# Patient Record
Sex: Male | Born: 1985 | Race: White | Hispanic: No | Marital: Single | Smoking: Never smoker
Health system: Southern US, Community
[De-identification: ages and names within clinical notes are randomized; demographics above are authoritative.]

## PROBLEM LIST (undated history)

## (undated) DIAGNOSIS — F32A Depression, unspecified: Secondary | ICD-10-CM

## (undated) DIAGNOSIS — F419 Anxiety disorder, unspecified: Secondary | ICD-10-CM

## (undated) DIAGNOSIS — F329 Major depressive disorder, single episode, unspecified: Secondary | ICD-10-CM

## (undated) DIAGNOSIS — R319 Hematuria, unspecified: Secondary | ICD-10-CM

## (undated) HISTORY — DX: Depression, unspecified: F32.A

## (undated) HISTORY — DX: Major depressive disorder, single episode, unspecified: F32.9

## (undated) HISTORY — DX: Hematuria, unspecified: R31.9

## (undated) HISTORY — PX: TYMPANOSTOMY TUBE PLACEMENT: SHX32

## (undated) HISTORY — DX: Anxiety disorder, unspecified: F41.9

---
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
## 2006-11-30 IMAGING — CR DG HAND COMPLETE 3+V*L*
1 series · 3 of 3 positions shown · non-contrast
Comparison: none

REASON FOR EXAM: Injury
COMMENTS:

PROCEDURE:     DXR - DXR HAND LT COMPLETE  W/OBLIQUES  - January 22, 2005 [DATE]
RESULT:     A transverse fracture is demonstrated along the midshaft of the
fourth metacarpal.  The distal fracture fragment demonstrates volar
angulation.  No further fractures or dislocations are appreciated.

[Series 1: view not recorded · 0.17mm/px · 3 of 3 slices shown]
[im 1/3]
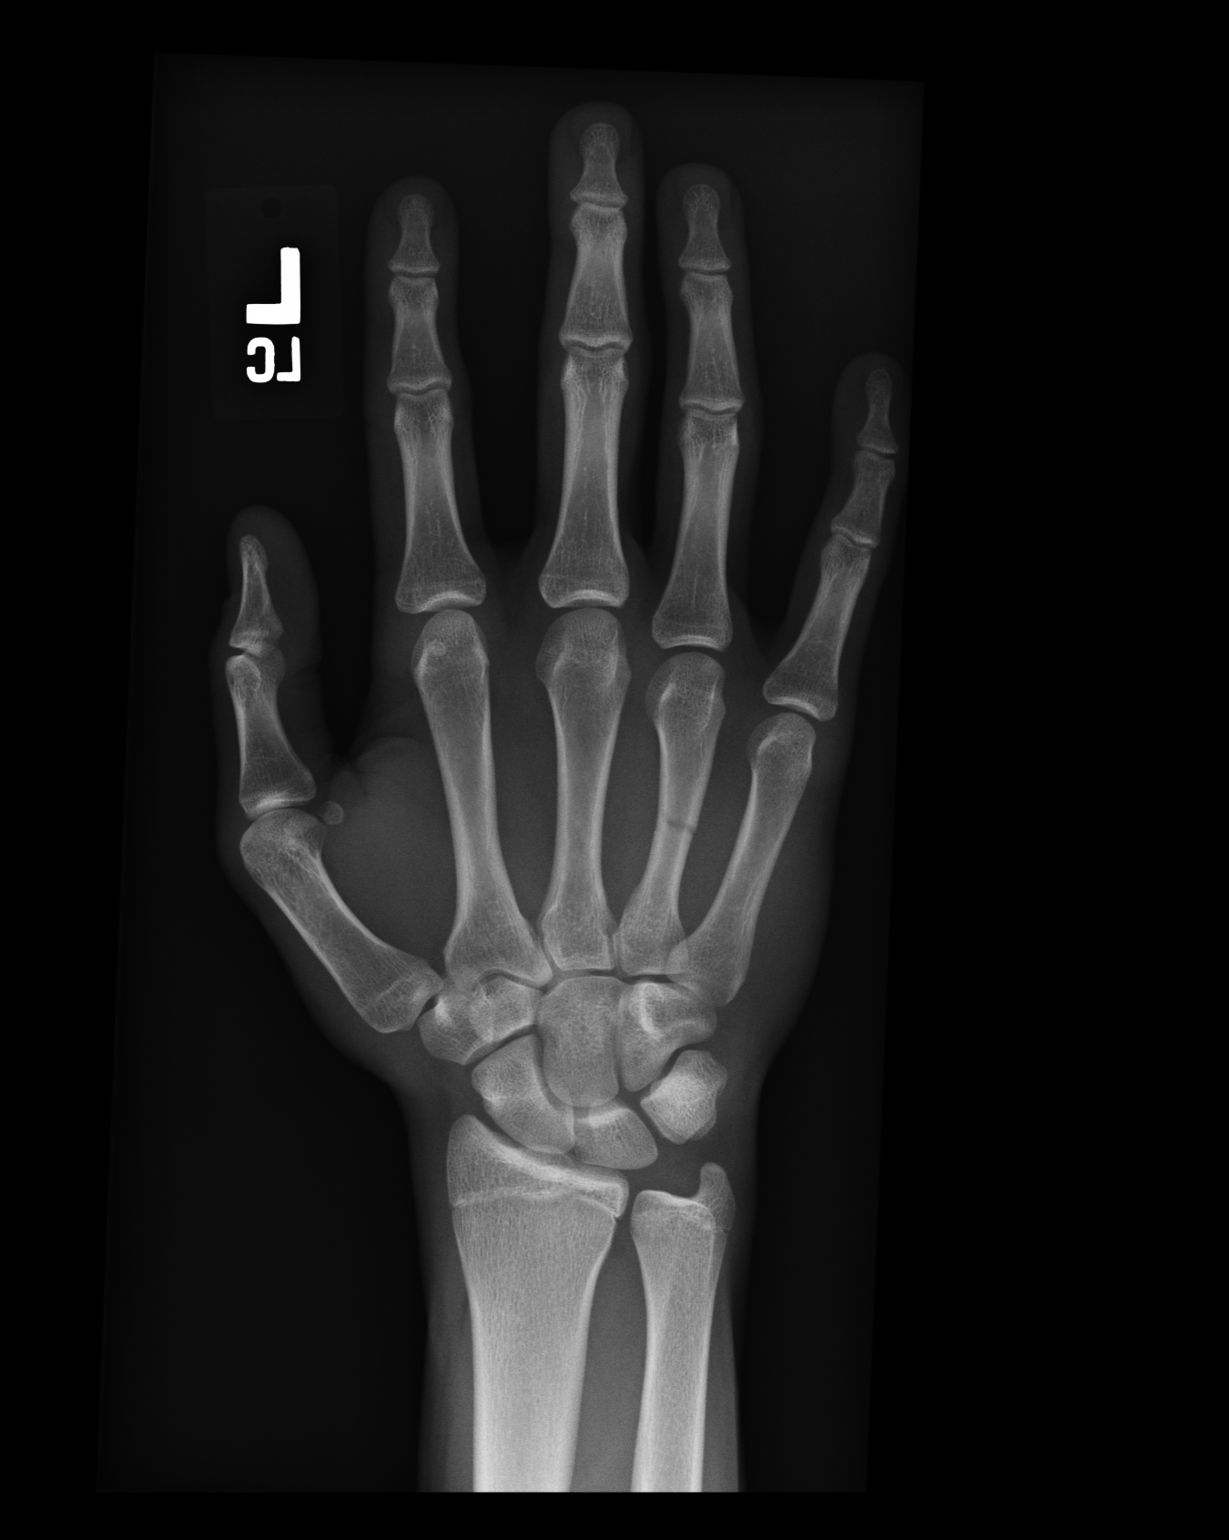
[im 2/3]
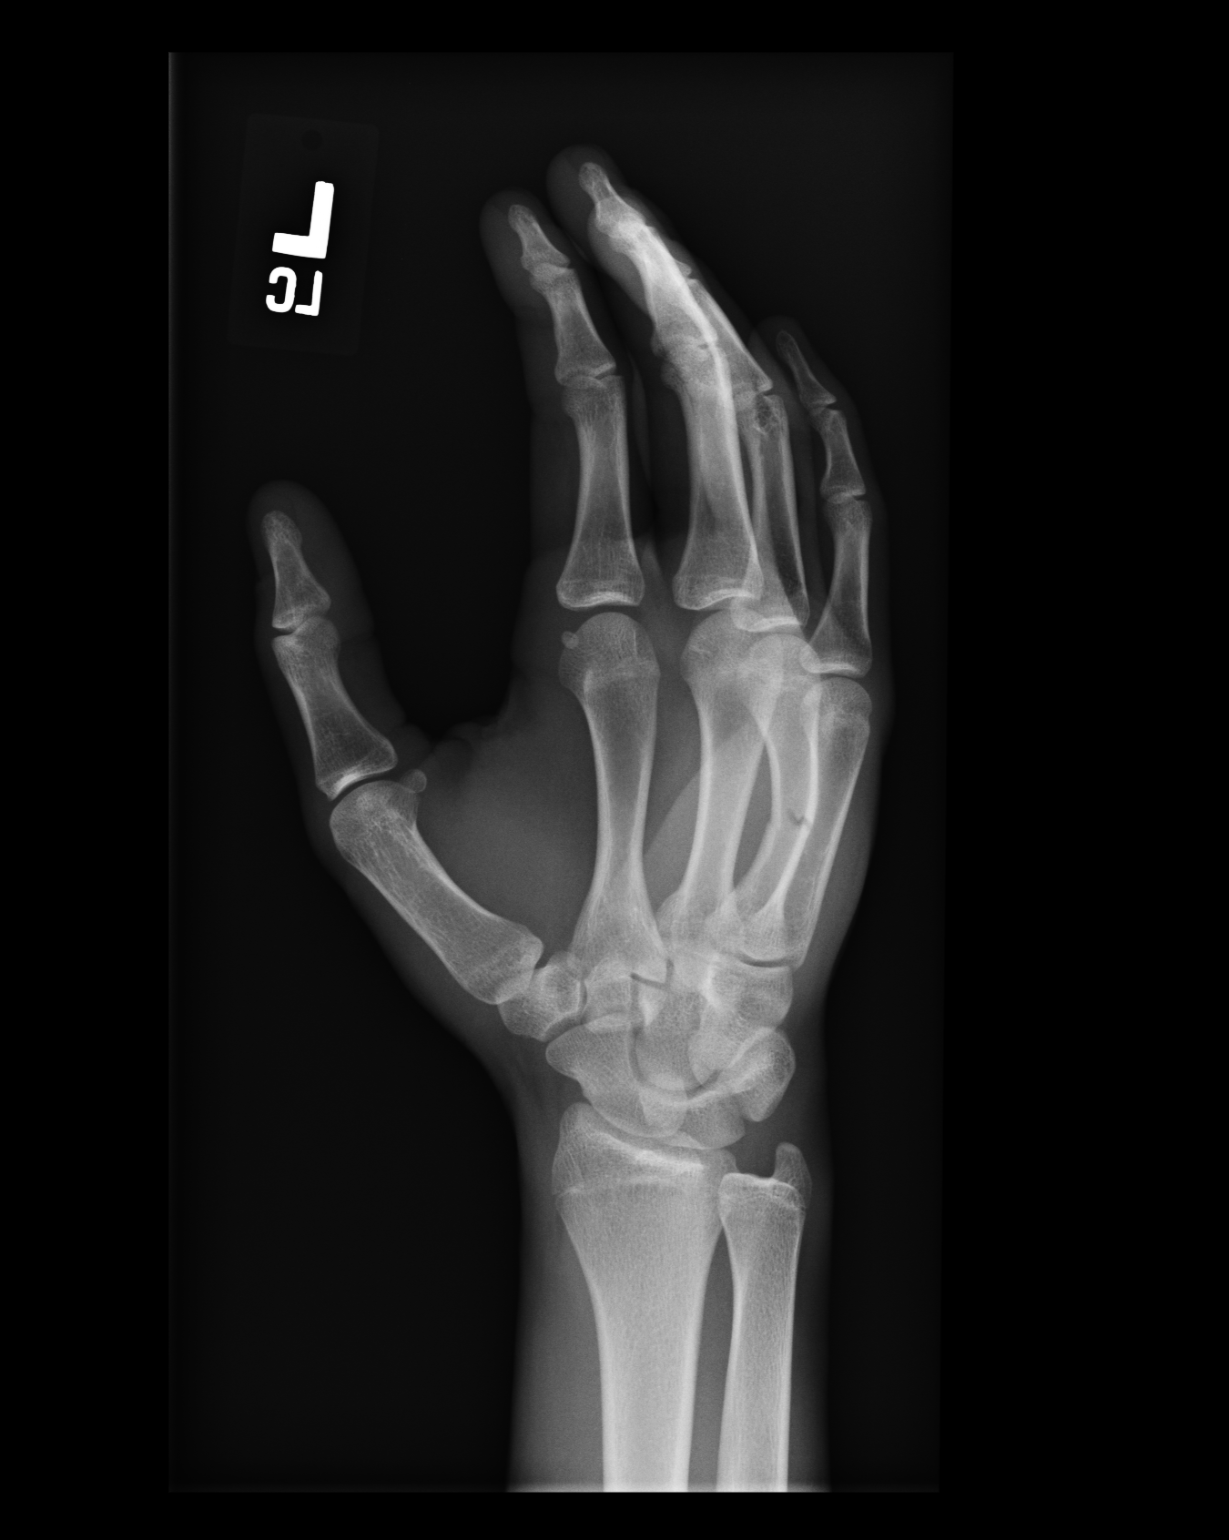
[im 3/3]
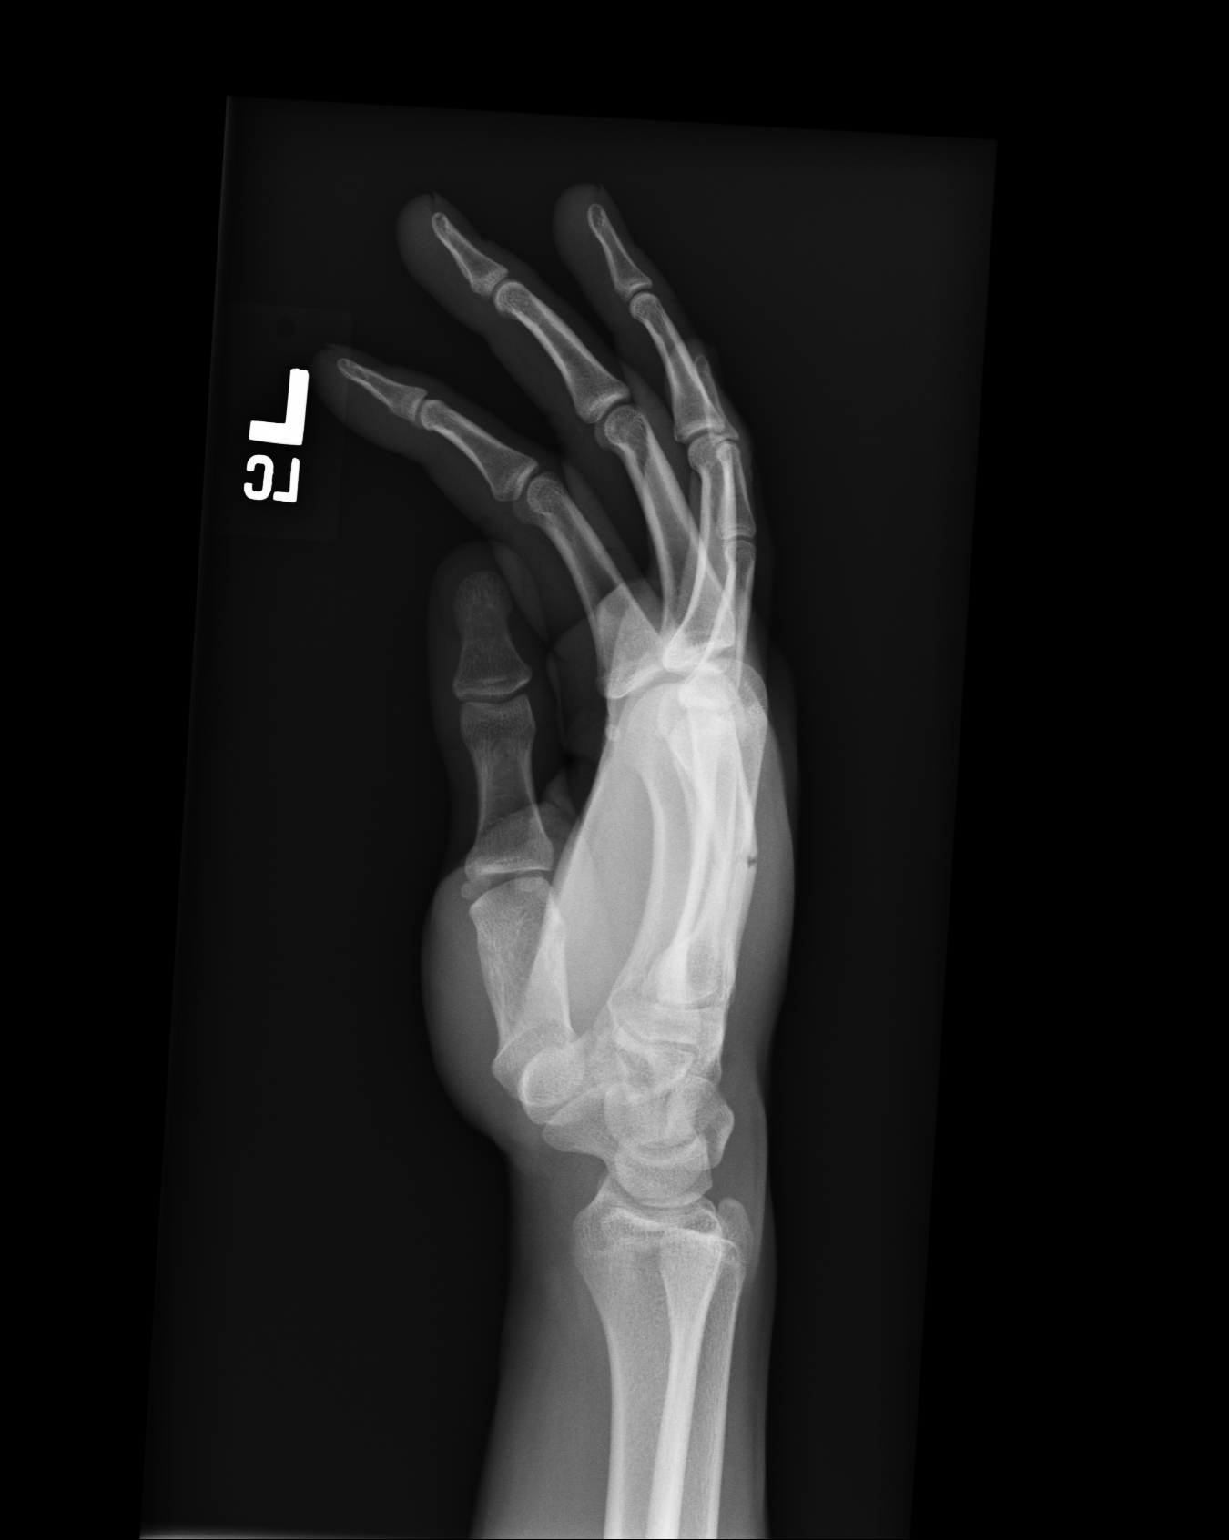

[3 of 3 positions shown; findings below may reference images not displayed]

IMPRESSION: See above.

## 2007-03-22 ENCOUNTER — Emergency Department: Payer: Self-pay | Admitting: Unknown Physician Specialty

## 2009-01-27 IMAGING — CR RIGHT HAND - COMPLETE 3+ VIEW
1 series · 3 of 3 positions shown · non-contrast
Comparison: none

REASON FOR EXAM: injury - mc 3
COMMENTS:

PROCEDURE:     DXR - DXR HAND RT COMPLETE W/OBLIQUES  - March 22, 2007  [DATE]
RESULT:     There is a minimally displaced fracture of the proximal portion
of the fifth metacarpal. No other fractures are identified.

[Series 1: view not recorded · 0.17mm/px · 3 of 3 slices shown]
[im 1/3]
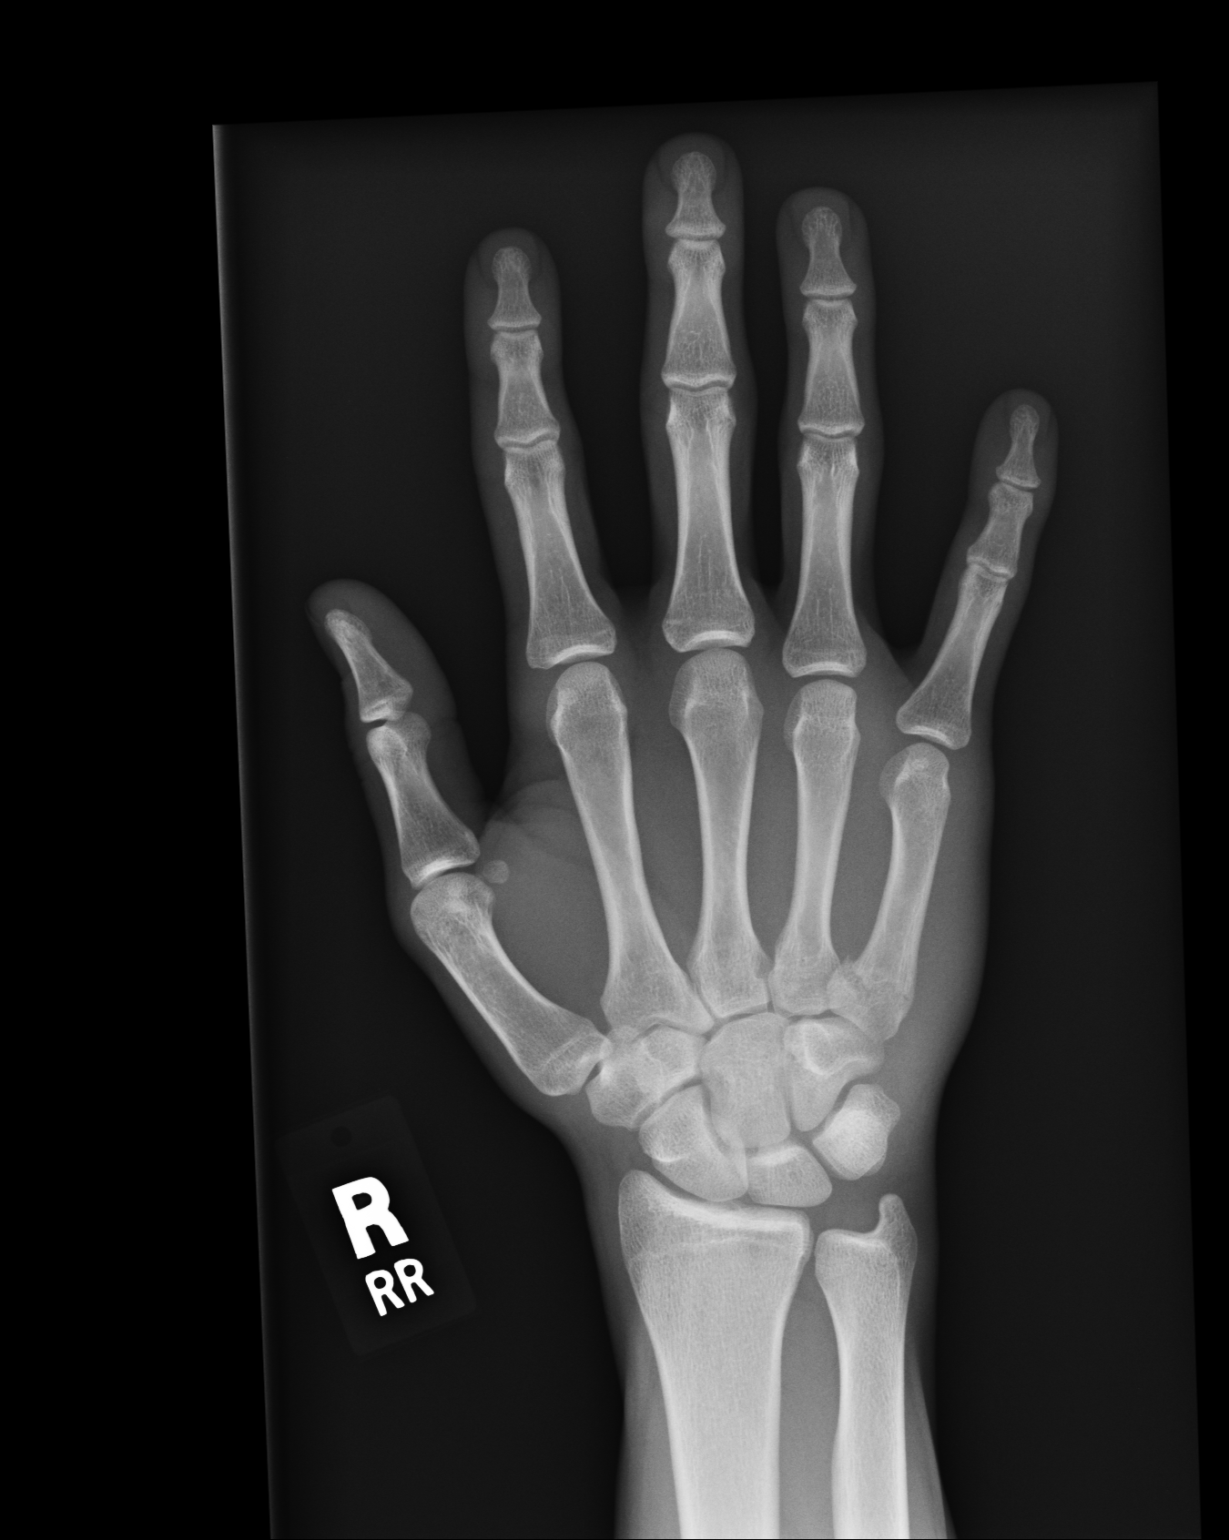
[im 2/3]
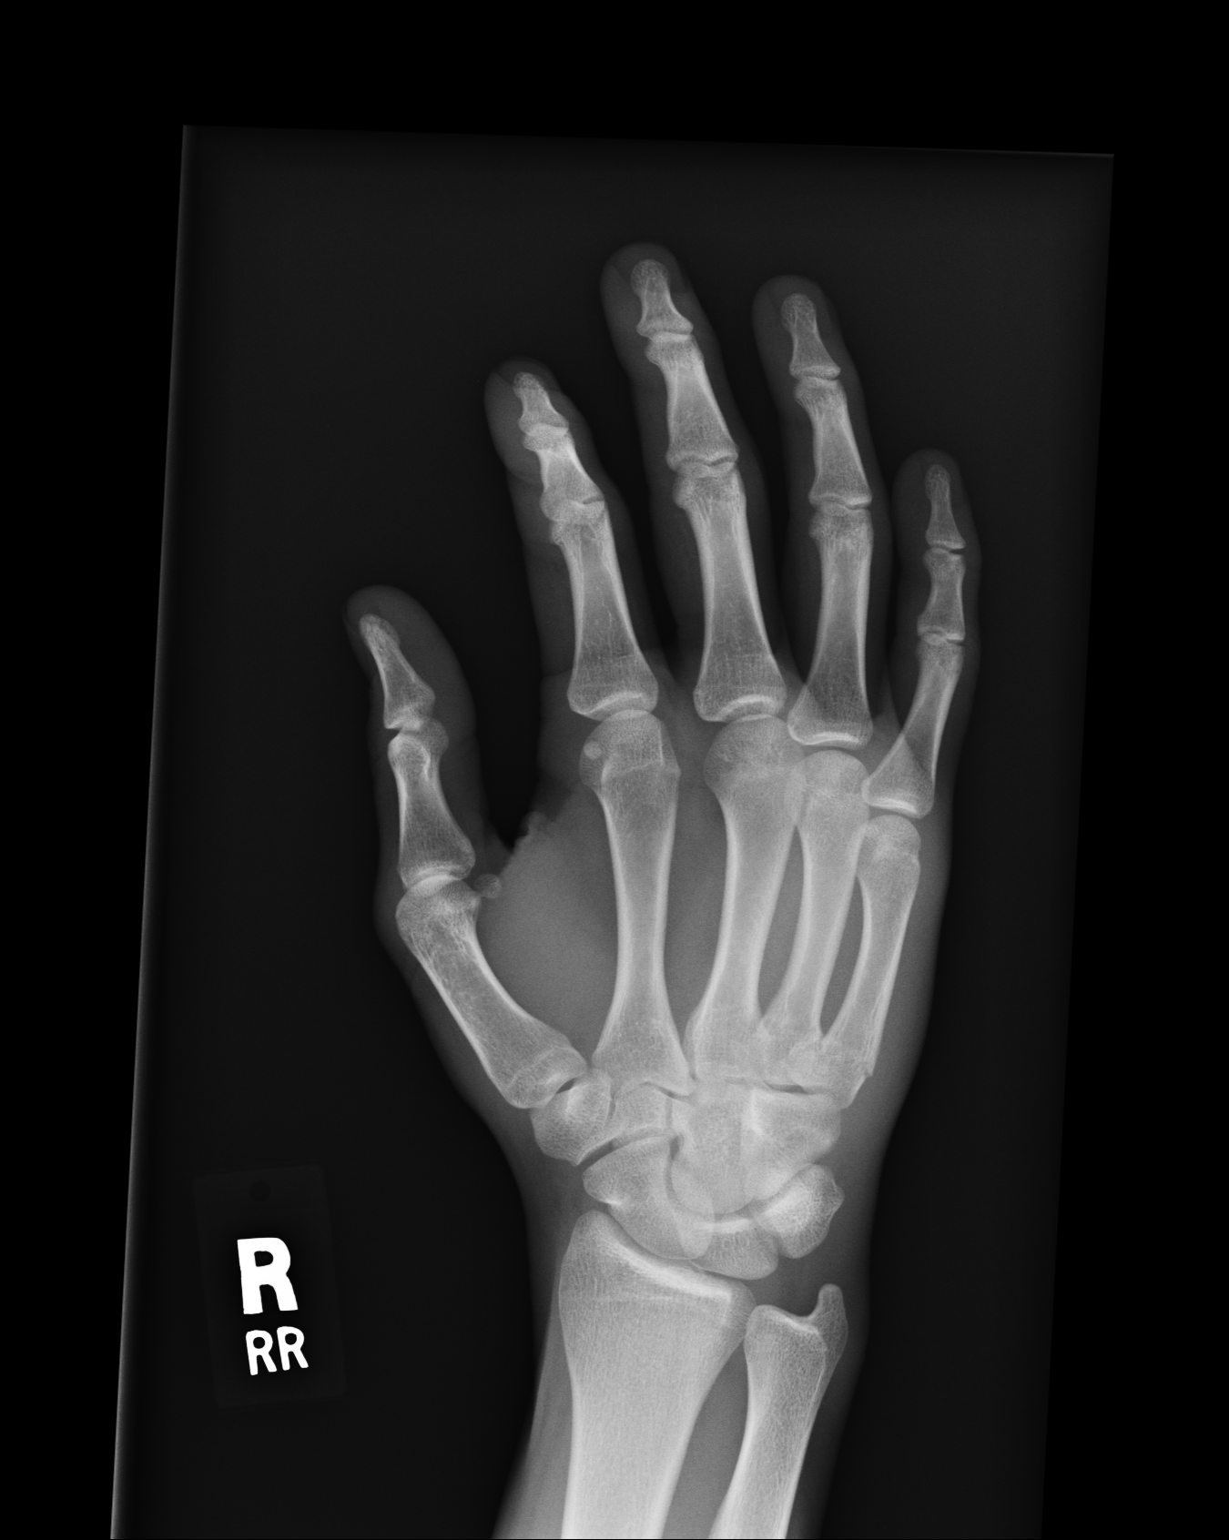
[im 3/3]
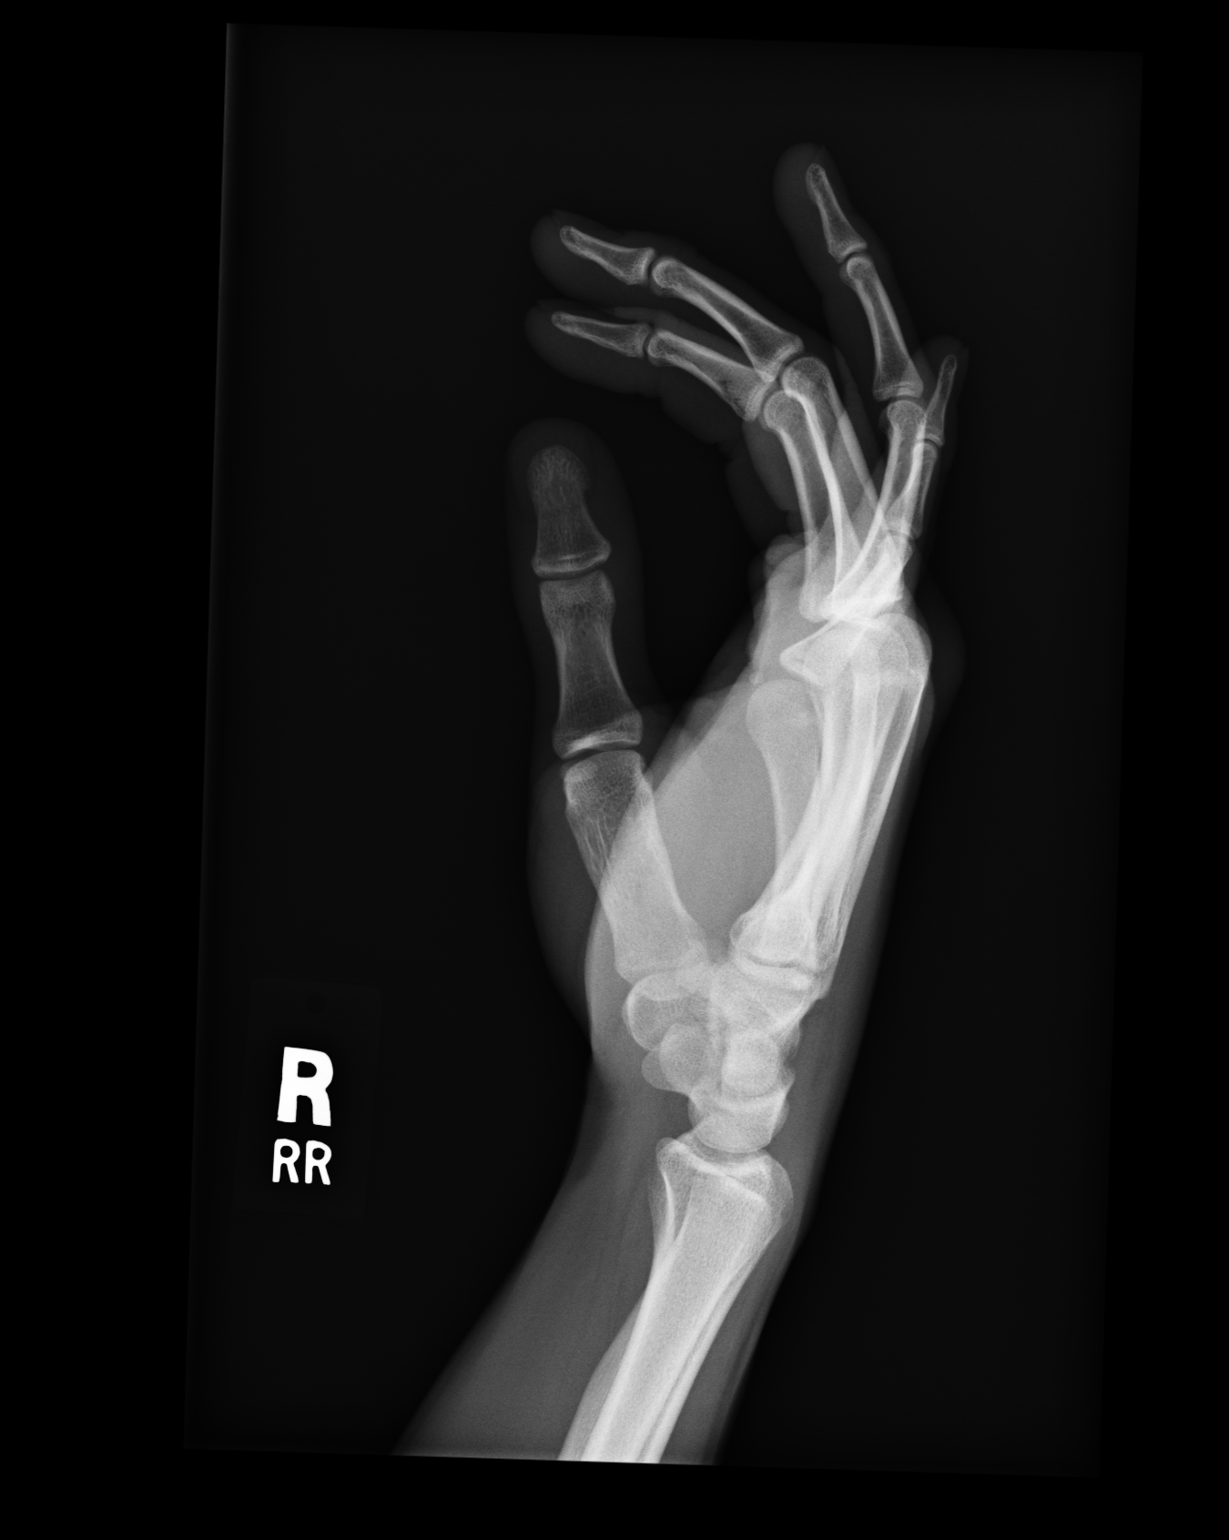

[3 of 3 positions shown; findings below may reference images not displayed]

IMPRESSION: Fracture of the fifth metacarpal, minimally displaced.

## 2010-03-08 ENCOUNTER — Ambulatory Visit: Payer: Self-pay | Admitting: General Practice

## 2012-12-14 ENCOUNTER — Emergency Department: Payer: Self-pay | Admitting: Emergency Medicine

## 2015-03-29 ENCOUNTER — Other Ambulatory Visit: Payer: Self-pay | Admitting: Family Medicine

## 2015-03-29 MED ORDER — CITALOPRAM HYDROBROMIDE 40 MG PO TABS
40.0000 mg | ORAL_TABLET | Freq: Every day | ORAL | Status: DC
Start: 2015-03-29 — End: 2015-04-14

## 2015-03-29 NOTE — Telephone Encounter (Signed)
Pt requests call back from [facility]. Thanks.

## 2015-03-29 NOTE — Telephone Encounter (Signed)
This is a [facility] patient, he has scheduled an appointment for 04/14/15. He needs a refill on the citalopram. The order is placed, please review sign and send.  Pharmacy is [facility] [address]

## 2015-04-14 ENCOUNTER — Encounter: Payer: Self-pay | Admitting: Family Medicine

## 2015-04-14 ENCOUNTER — Ambulatory Visit (INDEPENDENT_AMBULATORY_CARE_PROVIDER_SITE_OTHER): Payer: Self-pay | Admitting: Family Medicine

## 2015-04-14 VITALS — BP 116/71 | HR 58 | Temp 98.6°F | Ht 68.4 in | Wt 179.1 lb

## 2015-04-14 DIAGNOSIS — F32A Depression, unspecified: Secondary | ICD-10-CM | POA: Insufficient documentation

## 2015-04-14 DIAGNOSIS — F329 Major depressive disorder, single episode, unspecified: Secondary | ICD-10-CM

## 2015-04-14 MED ORDER — CITALOPRAM HYDROBROMIDE 40 MG PO TABS: 40.0000 mg | ORAL_TABLET | Freq: Every day | ORAL | Status: DC

## 2015-04-14 NOTE — Patient Instructions (Signed)
Stress and Stress Management Stress is a normal reaction to life events. It is what you feel when life demands more than you are used to or more than you can handle. Some stress can be useful. For example, the stress reaction can help you catch the last bus of the day, study for a test, or meet a deadline at work. But stress that occurs too often or for too long can cause problems. It can affect your emotional health and interfere with relationships and normal daily activities. Too much stress can weaken your immune system and increase your risk for physical illness. If you already have a medical problem, stress can make it worse. CAUSES  All sorts of life events may cause stress. An event that causes stress for one person may not be stressful for another person. Major life events commonly cause stress. These may be positive or negative. Examples include losing your job, moving into a new home, getting married, having a baby, or losing a loved one. Less obvious life events may also cause stress, especially if they occur day after day or in combination. Examples include working long hours, driving in traffic, caring for children, being in debt, or being in a difficult relationship. SIGNS AND SYMPTOMS Stress may cause emotional symptoms including, the following:  Anxiety. This is feeling worried, afraid, on edge, overwhelmed, or out of control.  Anger. This is feeling irritated or impatient.  Depression. This is feeling sad, down, helpless, or guilty.  Difficulty focusing, remembering, or making decisions. Stress may cause physical symptoms, including the following:   Aches and pains. These may affect your head, neck, back, stomach, or other areas of your body.  Tight muscles or clenched jaw.  Low energy or trouble sleeping. Stress may cause unhealthy behaviors, including the following:   Eating to feel better (overeating) or skipping meals.  Sleeping too little, too much, or both.  Working  too much or putting off tasks (procrastination).  Smoking, drinking alcohol, or using drugs to feel better. DIAGNOSIS  Stress is diagnosed through an assessment by your health care provider. Your health care provider will ask questions about your symptoms and any stressful life events.Your health care provider will also ask about your medical history and may order blood tests or other tests. Certain medical conditions and medicine can cause physical symptoms similar to stress. Mental illness can cause emotional symptoms and unhealthy behaviors similar to stress. Your health care provider may refer you to a mental health professional for further evaluation.  TREATMENT  Stress management is the recommended treatment for stress.The goals of stress management are reducing stressful life events and coping with stress in healthy ways.  Techniques for reducing stressful life events include the following:  Stress identification. Self-monitor for stress and identify what causes stress for you. These skills may help you to avoid some stressful events.  Time management. Set your priorities, keep a calendar of events, and learn to say "no." These tools can help you avoid making too many commitments. Techniques for coping with stress include the following:  Rethinking the problem. Try to think realistically about stressful events rather than ignoring them or overreacting. Try to find the positives in a stressful situation rather than focusing on the negatives.  Exercise. Physical exercise can release both physical and emotional tension. The key is to find a form of exercise you enjoy and do it regularly.  Relaxation techniques. These relax the body and mind. Examples include yoga, meditation, tai chi, biofeedback, deep  breathing, progressive muscle relaxation, listening to music, being out in nature, journaling, and other hobbies. Again, the key is to find one or more that you enjoy and can do  regularly.  Healthy lifestyle. Eat a balanced diet, get plenty of sleep, and do not smoke. Avoid using alcohol or drugs to relax.  Strong support network. Spend time with family, friends, or other people you enjoy being around.Express your feelings and talk things over with someone you trust. Counseling or talktherapy with a mental health professional may be helpful if you are having difficulty managing stress on your own. Medicine is typically not recommended for the treatment of stress.Talk to your health care provider if you think you need medicine for symptoms of stress. HOME CARE INSTRUCTIONS  Keep all follow-up visits as directed by your health care provider.  Take all medicines as directed by your health care provider. SEEK MEDICAL CARE IF:  Your symptoms get worse or you start having new symptoms.  You feel overwhelmed by your problems and can no longer manage them on your own. SEEK IMMEDIATE MEDICAL CARE IF:  You feel like hurting yourself or someone else. Document Released: 02/19/2001 Document Revised: 01/10/2014 Document Reviewed: 04/20/2013 Exit[employer] Patient Information 2015 [employer]. This information is not intended to replace advice given to you by your health care provider. Make sure you discuss any questions you have with your health care provider.  

## 2015-04-14 NOTE — Progress Notes (Signed)
BP 116/71 mmHg  Pulse 58  Temp(Src) 98.6 F (37 C)  Ht 5' 8.4" (1.737 m)  Wt 179 lb 1.6 oz (81.239 kg)  BMI 26.93 kg/m2  SpO2 98%   Subjective:    Patient ID: Aaron Flynn, male    DOB: [birth year], 29 y.o.   MRN: [id number]  HPI: Aaron Flynn is a 29 y.o. male  Chief Complaint  Patient presents with  . Anxiety  . Depression   DEPRESSION- has been back on his medicine for 2 weeks. Had been off the medicine for about a month. Was feeling well for about the first 2 months and then started feeling like it wasn't working the way that it was supposed to. Feeling more irritable and short fused. Got started back on his medicine again, but is afraid it'll stop working again after a couple of months.  Mood status: better but still not himself Satisfied with current treatment?: yes Symptom severity: moderate  Duration of current treatment : months Side effects: no Medication compliance: excellent compliance Depressed mood: no Anxious mood: yes Anhedonia: no Significant weight loss or gain: no Insomnia: no  Fatigue: no Feelings of worthlessness or guilt: no Impaired concentration/indecisiveness: yes Suicidal ideations: no Hopelessness: no Crying spells: no Depression screen PHQ 2/9 04/14/2015  Decreased Interest 1  Down, Depressed, Hopeless 1  PHQ - 2 Score 2   GAD7: 13 (see scan) ANXIETY/STRESS Duration:stable Anxious mood: yes  Excessive worrying: yes Irritability: yes  Sweating: no Nausea: no Palpitations:no Hyperventilation: no Panic attacks: no Agoraphobia: no  Obscessions/compulsions: no Depressed mood: no  Recent Stressors/Life Changes: yes  Relevant past medical, surgical, family and social history reviewed and updated as indicated. Interim medical history since our last visit reviewed. Allergies and medications reviewed and updated.  Review of Systems  Constitutional: Negative.   Respiratory: Negative.   Cardiovascular: Negative.   Skin:  Negative.   Psychiatric/Behavioral: Negative.    Per HPI unless specifically indicated above     Objective:    BP 116/71 mmHg  Pulse 58  Temp(Src) 98.6 F (37 C)  Ht 5' 8.4" (1.737 m)  Wt 179 lb 1.6 oz (81.239 kg)  BMI 26.93 kg/m2  SpO2 98%  Wt Readings from Last 3 Encounters:  04/14/15 179 lb 1.6 oz (81.239 kg)  04/14/15 180 lb (81.647 kg)    Physical Exam  Constitutional: He is oriented to person, place, and time. He appears well-developed and well-nourished. No distress.  HENT:  Head: Normocephalic and atraumatic.  Right Ear: Hearing normal.  Left Ear: Hearing normal.  Nose: Nose normal.  Eyes: Conjunctivae and lids are normal. Right eye exhibits no discharge. Left eye exhibits no discharge. No scleral icterus.  Cardiovascular: Normal rate, regular rhythm and normal heart sounds.  Exam reveals no gallop and no friction rub.   No murmur heard. Pulmonary/Chest: Effort normal and breath sounds normal. No respiratory distress. He has no wheezes. He has no rales. He exhibits no tenderness.  Musculoskeletal: Normal range of motion.  Neurological: He is alert and oriented to person, place, and time.  Skin: Skin is warm, dry and intact. No rash noted. No erythema. No pallor.  Psychiatric: He has a normal mood and affect. His speech is normal and behavior is normal. Judgment and thought content normal. Cognition and memory are normal.  Nursing note and vitals reviewed.   No results found for this or any previous visit.    Assessment & Plan:   Problem List Items Addressed This Visit  Other   Depression - Primary    Will continue him on his current dose. Will check back in in 6 weeks to make sure that medication is still working. As he has anxiety as well, we could increase dose to 60mg  daily if he continues to feel anxious and irritable. Continue current regimen. Continue to monitor.       Relevant Medications   citalopram (CELEXA) 40 MG tablet       Follow up  plan: Return in about 6 weeks (around 05/26/2015).

## 2015-04-14 NOTE — Assessment & Plan Note (Signed)
Will continue him on his current dose. Will check back in in 6 weeks to make sure that medication is still working. As he has anxiety as well, we could increase dose to  daily if he continues to feel anxious and irritable. Continue current regimen. Continue to monitor.

## 2015-05-26 ENCOUNTER — Encounter: Payer: Self-pay | Admitting: Family Medicine

## 2015-05-26 ENCOUNTER — Ambulatory Visit (INDEPENDENT_AMBULATORY_CARE_PROVIDER_SITE_OTHER): Payer: Self-pay | Admitting: Family Medicine

## 2015-05-26 VITALS — BP 139/76 | HR 66 | Temp 98.6°F | Wt 178.0 lb

## 2015-05-26 DIAGNOSIS — F32A Depression, unspecified: Secondary | ICD-10-CM

## 2015-05-26 DIAGNOSIS — F329 Major depressive disorder, single episode, unspecified: Secondary | ICD-10-CM

## 2015-05-26 MED ORDER — CITALOPRAM HYDROBROMIDE 40 MG PO TABS: 40.0000 mg | ORAL_TABLET | Freq: Every day | ORAL | Status: AC

## 2015-05-26 NOTE — Progress Notes (Signed)
BP 139/76 mmHg  Pulse 66  Temp(Src) 98.6 F (37 C)  Wt 178 lb (80.74 kg)  SpO2 98%   Subjective:    Patient ID: Aaron Flynn, male    DOB: Jul 27, 1986, 29 y.o.   MRN: [id number]  HPI: ATTHEW COUTANT is a 29 y.o. male  Chief Complaint  Patient presents with  . Depression   DEPRESSION- doing really well on his celexa. Ran out 2 days ago, but not feeling any withdrawal symptoms. No other concerns.  Mood status: controlled Satisfied with current treatment?: yes Symptom severity: mild  Duration of current treatment : 1.5 months Side effects: no Medication compliance:  Psychotherapy/counseling: no  Depressed mood: no Anxious mood: no Anhedonia: no Significant weight loss or gain: no Insomnia: yes hard to stay asleep Fatigue: no Feelings of worthlessness or guilt: no Impaired concentration/indecisiveness: no Suicidal ideations: no Hopelessness: no Crying spells: no Depression screen [facility] 2/9 05/26/2015 04/14/2015  Decreased Interest 1 1  Down, Depressed, Hopeless 1 1  PHQ - 2 Score 2 2    Relevant past medical, surgical, family and social history reviewed and updated as indicated. Interim medical history since our last visit reviewed. Allergies and medications reviewed and updated.  Review of Systems  Constitutional: Negative.   Respiratory: Negative.   Cardiovascular: Negative.   Musculoskeletal: Negative.   Psychiatric/Behavioral: Negative.     Per HPI unless specifically indicated above     Objective:    BP 139/76 mmHg  Pulse 66  Temp(Src) 98.6 F (37 C)  Wt 178 lb (80.74 kg)  SpO2 98%  Wt Readings from Last 3 Encounters:  05/26/15 178 lb (80.74 kg)  04/14/15 179 lb 1.6 oz (81.239 kg)  04/14/15 180 lb (81.647 kg)    Physical Exam  Constitutional: He is oriented to person, place, and time. He appears well-developed and well-nourished. No distress.  HENT:  Head: Normocephalic and atraumatic.  Right Ear: Hearing normal.  Left Ear: Hearing normal.   Nose: Nose normal.  Eyes: Conjunctivae and lids are normal. Right eye exhibits no discharge. Left eye exhibits no discharge. No scleral icterus.  Cardiovascular: Normal rate, regular rhythm, normal heart sounds and intact distal pulses.  Exam reveals no gallop and no friction rub.   No murmur heard. Pulmonary/Chest: Effort normal and breath sounds normal. No respiratory distress. He has no wheezes. He has no rales. He exhibits no tenderness.  Musculoskeletal: Normal range of motion.  Neurological: He is alert and oriented to person, place, and time.  Skin: Skin is warm, dry and intact. No rash noted. No erythema. No pallor.  Psychiatric: He has a normal mood and affect. His speech is normal and behavior is normal. Judgment and thought content normal. Cognition and memory are normal.  Nursing note and vitals reviewed.   No results found for this or any previous visit.    Assessment & Plan:   Problem List Items Addressed This Visit      Other   Depression - Primary    Under good control on his current regimen. Will continue. Recheck in 6 months at physical. Refill given today. Call with any problems.       Relevant Medications   citalopram (CELEXA) 40 MG tablet       Follow up plan: Return in about 6 months (around 11/23/2015) for Physical.

## 2015-05-26 NOTE — Patient Instructions (Signed)

## 2015-05-26 NOTE — Assessment & Plan Note (Signed)
Under good control on his current regimen. Will continue. Recheck in 6 months at physical. Refill given today. Call with any problems.

## 2015-11-23 ENCOUNTER — Encounter: Payer: Self-pay | Admitting: Family Medicine

## 2015-12-08 ENCOUNTER — Encounter: Payer: Self-pay | Admitting: Family Medicine

## 2019-04-05 ENCOUNTER — Emergency Department
Admission: EM | Admit: 2019-04-05 | Discharge: 2019-04-05 | Disposition: A | Discharge status: Home / Self Care | Payer: Self-pay | Attending: Student in an Organized Health Care Education/Training Program | Admitting: Student in an Organized Health Care Education/Training Program

## 2019-04-05 ENCOUNTER — Encounter: Payer: Self-pay | Admitting: *Deleted

## 2019-04-05 ENCOUNTER — Other Ambulatory Visit: Payer: Self-pay

## 2019-04-05 DIAGNOSIS — F17200 Nicotine dependence, unspecified, uncomplicated: Secondary | ICD-10-CM | POA: Insufficient documentation

## 2019-04-05 DIAGNOSIS — Z79899 Other long term (current) drug therapy: Secondary | ICD-10-CM | POA: Insufficient documentation

## 2019-04-05 DIAGNOSIS — L03114 Cellulitis of left upper limb: Secondary | ICD-10-CM | POA: Insufficient documentation

## 2019-04-05 MED ORDER — SULFAMETHOXAZOLE-TRIMETHOPRIM 800-160 MG PO TABS: 1.0000 | ORAL_TABLET | Freq: Two times a day (BID) | ORAL | 0 refills | Status: AC

## 2019-04-05 MED ORDER — ONDANSETRON HCL 4 MG PO TABS: 4.0000 mg | ORAL_TABLET | Freq: Every day | ORAL | 0 refills | Status: AC | PRN

## 2019-04-05 NOTE — ED Triage Notes (Signed)
Pt presents w/ wound to posterior LFA. Pt states 5 day hx of wound. Pt did not see insect that bit him. 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 Pt states wound has been draining.

## 2019-04-05 NOTE — ED Provider Notes (Signed)
[facility] Emergency Department Provider Note  ____________________________________________  Time seen: Approximately 6:02 PM  I have reviewed the triage vital signs and the nursing notes.   HISTORY  Chief Complaint Insect Bite   HPI Aaron Flynn is a 33 y.o. male who presents to the emergency department for treatment and evaluation of lesion to his left forearm.  He suspects that it is some type of spider bite but he did not see it.  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  Wound has been draining yellow pus, but does not seem like its getting much better.  He denies fever.  He does state that he has felt little bit nauseated today.  No known history of MRSA   Past Medical History:  Diagnosis Date  . Anxiety   . Depression   . Hematuria     Patient Active Problem List   Diagnosis Date Noted  . Depression 04/14/2015    Past Surgical History:  Procedure Laterality Date  . TYMPANOSTOMY TUBE PLACEMENT      Prior to Admission medications   Medication Sig Start Date End Date Taking? Authorizing Provider  citalopram (CELEXA) 40 MG tablet Take 1 tablet (40 mg total) by mouth daily. 05/26/15   Johnson, Megan P, DO  ondansetron (ZOFRAN) 4 MG tablet Take 1 tablet (4 mg total) by mouth daily as needed for nausea or vomiting. 04/05/19 04/04/20  Edsel Shives, Johnette Abraham B, FNP  sulfamethoxazole-trimethoprim (BACTRIM DS) 800-160 MG tablet Take 1 tablet by mouth 2 (two) times daily. 04/05/19   Victorino Dike, FNP    Allergies Patient has no known allergies.  Family History  Problem Relation Age of Onset  . Heart attack Maternal Grandmother   . Liver disease Paternal Grandfather     Social History Social History   Tobacco Use  . Smoking status: Never Smoker  . Smokeless tobacco: Current User    Types: Snuff  Substance Use Topics  . Alcohol use: No  . Drug use: No    Review of Systems  Constitutional:  Negative for fever. Respiratory: Negative for cough or shortness of breath.  Musculoskeletal: Negative for myalgias Skin: Positive for lesion to the left forearm Neurological: Negative for numbness or paresthesias. ____________________________________________   PHYSICAL EXAM:  VITAL SIGNS: ED Triage Vitals  Enc Vitals Group     BP 04/05/19 1543 124/85     Pulse Rate 04/05/19 1543 (!) 52     Resp 04/05/19 1543 16     Temp 04/05/19 1543 98.8 F (37.1 C)     Temp Source 04/05/19 1543 Oral     SpO2 04/05/19 1543 98 %     Weight 04/05/19 1545 185 lb (83.9 kg)     Height 04/05/19 1545 5\' 10"  (1.778 m)     Head Circumference --      Peak Flow --      Pain Score 04/05/19 1544 6     Pain Loc --      Pain Edu? --      Excl. in [place]? --      Constitutional: Well appearing. Eyes: Conjunctivae are clear without discharge or drainage. Nose: No rhinorrhea noted. Mouth/Throat: Airway is patent.  Neck: No stridor. Unrestricted range of motion observed. Cardiovascular: Capillary refill is <3 seconds.  Respiratory: Respirations are even and unlabored.. Musculoskeletal: Unrestricted range of motion observed. Neurologic: Awake, alert, and oriented x 4.  Skin: 3 cm area  of erythema with central area of fluctuance and purulent drainage on the left forearm.  ____________________________________________   LABS (all labs ordered are listed, but only abnormal results are displayed)  Labs Reviewed - No data to display ____________________________________________  EKG  Not indicated. ____________________________________________  RADIOLOGY  Not indicated ____________________________________________   PROCEDURES  Procedures ____________________________________________   INITIAL IMPRESSION / ASSESSMENT AND PLAN / ED COURSE  Aaron Flynn is a 33 y.o. male presenting to the emergency department with a history as described in the HPI.  Symptoms and exam are most consistent with  some type spider bite.  He will be treated with Bactrim and Zofran.  He was advised to return to the emergency department if he notices any sign of lymphangitis.  He was instructed to return to the emergency department for fever or other concerns as well if he is unable to schedule an appointment with primary care.   Medications - No data to display   Pertinent labs & imaging results that were available during my care of the patient were reviewed by me and considered in my medical decision making (see chart for details).  ____________________________________________   FINAL CLINICAL IMPRESSION(S) / ED DIAGNOSES  Final diagnoses:  Cellulitis of left upper extremity    ED Discharge Orders         Ordered    sulfamethoxazole-trimethoprim (BACTRIM DS) 800-160 MG tablet  2 times daily     04/05/19 1623    ondansetron (ZOFRAN) 4 MG tablet  Daily PRN     04/05/19 1623           Note:  This document was prepared using Dragon voice recognition software and may include unintentional dictation errors.   Chinita Pesterriplett, Tameaka Eichhorn B, FNP 04/05/19 1805    Willy Eddyobinson, Patrick, MD 04/06/19 1901
